# Patient Record
Sex: Male | Born: 2000 | Race: Black or African American | Hispanic: No | Marital: Single | Smoking: Never smoker
Health system: Southern US, Community
[De-identification: ages and names within clinical notes are randomized; demographics above are authoritative.]

## PROBLEM LIST (undated history)

## (undated) DIAGNOSIS — I861 Scrotal varices: Secondary | ICD-10-CM

---
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
## 2019-03-21 ENCOUNTER — Other Ambulatory Visit: Payer: Self-pay

## 2019-03-21 DIAGNOSIS — Z20822 Contact with and (suspected) exposure to covid-19: Secondary | ICD-10-CM

## 2019-03-22 LAB — NOVEL CORONAVIRUS, NAA: SARS-CoV-2, NAA: NOT DETECTED

## 2019-05-26 ENCOUNTER — Other Ambulatory Visit: Payer: Self-pay

## 2019-05-26 DIAGNOSIS — Z20822 Contact with and (suspected) exposure to covid-19: Secondary | ICD-10-CM

## 2019-05-28 LAB — NOVEL CORONAVIRUS, NAA: SARS-CoV-2, NAA: NOT DETECTED

## 2021-02-05 ENCOUNTER — Encounter (HOSPITAL_COMMUNITY): Payer: Self-pay

## 2021-02-05 ENCOUNTER — Ambulatory Visit (HOSPITAL_COMMUNITY)
Admission: RE | Admit: 2021-02-05 | Discharge: 2021-02-05 | Disposition: A | Discharge status: Home / Self Care | Payer: 59 | Source: Ambulatory Visit | Attending: Physician Assistant | Admitting: Physician Assistant

## 2021-02-05 ENCOUNTER — Other Ambulatory Visit: Payer: Self-pay

## 2021-02-05 VITALS — BP 117/67 | HR 70 | Temp 98.4°F | Resp 18

## 2021-02-05 DIAGNOSIS — U071 COVID-19: Secondary | ICD-10-CM | POA: Diagnosis not present

## 2021-02-05 DIAGNOSIS — J069 Acute upper respiratory infection, unspecified: Secondary | ICD-10-CM | POA: Insufficient documentation

## 2021-02-05 DIAGNOSIS — R059 Cough, unspecified: Secondary | ICD-10-CM | POA: Diagnosis present

## 2021-02-05 LAB — SARS CORONAVIRUS 2 (TAT 6-24 HRS): SARS Coronavirus 2: POSITIVE — AB

## 2021-02-05 MED ORDER — PREDNISONE 20 MG PO TABS: 40.0000 mg | ORAL_TABLET | Freq: Every day | ORAL | 0 refills | Status: AC

## 2021-02-05 MED ORDER — BENZONATATE 100 MG PO CAPS: 100.0000 mg | ORAL_CAPSULE | Freq: Three times a day (TID) | ORAL | 0 refills | Status: AC

## 2021-02-05 NOTE — Discharge Instructions (Addendum)
Monitor your MyChart for your COVID-19 results.  Take prednisone burst (40 mg for 3 days) to help with symptoms.  While taking this medication do not take ibuprofen, aspirin, Aleve/naproxen.  You can use Tylenol/acetaminophen for pain and other symptoms.  Use Tessalon up to 3 times a day as needed for cough.  I recommend using Flonase and Mucinex over-the-counter to help with your symptoms.  If anything worsens please return for reevaluation.

## 2021-02-05 NOTE — ED Provider Notes (Signed)
[facility]    CSN: [id number] Arrival date & time: 02/05/21  1027      History   Chief Complaint Chief Complaint  Patient presents with   Fever   Headache   Chills   Cough    HPI Chase Massey is a 20 y.o. male.   Patient presents today with a 5-day history of URI symptoms.  He reports subjective fever, headache, chills, cough, body aches, diarrhea.  Denies any chest pain, shortness of breath, nausea, vomiting.  He has tried Tylenol without improvement of symptoms.  Reports several were contacts that have recently been diagnosed with COVID-19.  Patient reports he had COVID-19 vaccination but has not had booster.  He has not had influenza vaccine.  He denies history of asthma, allergies, COPD.  He does not smoke cigarettes but does vape.  He denies any recent antibiotic use.  He is requesting a COVID-19 test to return to work.   Past Medical History:  Diagnosis Date   Left varicocele     Patient Active Problem List   Diagnosis Date Noted   Testicular/scrotal pain     History reviewed. No pertinent surgical history.     Home Medications    Prior to Admission medications   Medication Sig Start Date End Date Taking? Authorizing Provider  benzonatate (TESSALON) 100 MG capsule Take 1 capsule (100 mg total) by mouth every 8 (eight) hours. 02/05/21  Yes Aletheia Tangredi K, PA-C  predniSONE (DELTASONE) 20 MG tablet Take 2 tablets (40 mg total) by mouth daily for 3 days. 02/05/21 02/08/21 Yes Bannie Lobban, Noberto Retort, PA-C    Family History History reviewed. No pertinent family history.  Social History Social History   Tobacco Use   Smoking status: Never   Smokeless tobacco: Never  Vaping Use   Vaping Use: Every day  Substance Use Topics   Alcohol use: Yes    Comment: ocass   Drug use: No     Allergies   Patient has no known allergies.   Review of Systems Review of Systems  Constitutional:  Positive for activity change, appetite change, fatigue and fever.   HENT:  Positive for congestion and sore throat. Negative for sinus pressure and sneezing.   Respiratory:  Positive for cough. Negative for shortness of breath.   Cardiovascular:  Negative for chest pain.  Gastrointestinal:  Positive for diarrhea. Negative for abdominal pain, nausea and vomiting.  Musculoskeletal:  Positive for arthralgias and myalgias.  Neurological:  Positive for headaches. Negative for dizziness and light-headedness.    Physical Exam Triage Vital Signs ED Triage Vitals  Enc Vitals Group     BP 02/05/21 1108 117/67     Pulse Rate 02/05/21 1108 70     Resp 02/05/21 1108 18     Temp 02/05/21 1108 98.4 F (36.9 C)     Temp Source 02/05/21 1108 Oral     SpO2 02/05/21 1108 98 %     Weight --      Height --      Head Circumference --      Peak Flow --      Pain Score 02/05/21 1105 0     Pain Loc --      Pain Edu? --      Excl. in GC? --    No data found.  Updated Vital Signs BP 117/67   Pulse 70   Temp 98.4 F (36.9 C) (Oral)   Resp 18   SpO2 98%   Visual  Acuity Right Eye Distance:   Left Eye Distance:   Bilateral Distance:    Right Eye Near:   Left Eye Near:    Bilateral Near:     Physical Exam Vitals reviewed.  Constitutional:      General: He is awake.     Appearance: Normal appearance. He is normal weight. He is not ill-appearing.     Comments: Very pleasant male appears stated age in no acute distress  HENT:     Head: Normocephalic and atraumatic.     Right Ear: Tympanic membrane, ear canal and external ear normal. Tympanic membrane is not erythematous or bulging.     Left Ear: Tympanic membrane, ear canal and external ear normal. Tympanic membrane is not erythematous or bulging.     Nose: Nose normal.     Mouth/Throat:     Pharynx: Uvula midline. No oropharyngeal exudate or posterior oropharyngeal erythema.     Tonsils: 1+ on the right. 1+ on the left.  Cardiovascular:     Rate and Rhythm: Normal rate and regular rhythm.     Heart  sounds: Normal heart sounds, S1 normal and S2 normal. No murmur heard. Pulmonary:     Effort: Pulmonary effort is normal. No accessory muscle usage or respiratory distress.     Breath sounds: Normal breath sounds. No stridor. No wheezing, rhonchi or rales.     Comments: Clear to auscultation bilaterally Abdominal:     General: Bowel sounds are normal.     Palpations: Abdomen is soft.     Tenderness: There is no abdominal tenderness.  Lymphadenopathy:     Head:     Right side of head: No submental, submandibular or tonsillar adenopathy.     Left side of head: No submental, submandibular or tonsillar adenopathy.     Cervical: No cervical adenopathy.  Neurological:     Mental Status: He is alert.  Psychiatric:        Behavior: Behavior is cooperative.     UC Treatments / Results  Labs (all labs ordered are listed, but only abnormal results are displayed) Labs Reviewed  SARS CORONAVIRUS 2 (TAT 6-24 HRS)    EKG   Radiology No results found.  Procedures Procedures (including critical care time)  Medications Ordered in UC Medications - No data to display  Initial Impression / Assessment and Plan / UC Course  I have reviewed the triage vital signs and the nursing notes.  Pertinent labs & imaging results that were available during my care of the patient were reviewed by me and considered in my medical decision making (see chart for details).      No indication for flu testing given patient has been symptomatic for 5 days.  COVID-19 test was obtained per his request.  Encouraged him to follow results on MyChart we will contact him by phone if positive.  Discussed likely viral etiology of symptoms and that there is no indication for antibiotic use based on physical exam today.  Patient was prescribed prednisone burst (40 mg x 3 days) to help manage symptoms with instruction not to take NSAIDs with this medication due to risk of GI bleeding.  He was prescribed Tessalon for cough  symptoms.  Recommended he use over-the-counter medications including Tylenol, Flonase, Mucinex for symptom relief.  Discussed alarm symptoms or warrant emergent evaluation.  Strict return precautions given to which patient expressed understanding.  Final Clinical Impressions(s) / UC Diagnoses   Final diagnoses:  Upper respiratory tract infection, unspecified type  Cough     Discharge Instructions      Monitor your MyChart for your COVID-19 results.  Take prednisone burst (40 mg for 3 days) to help with symptoms.  While taking this medication do not take ibuprofen, aspirin, Aleve/naproxen.  You can use Tylenol/acetaminophen for pain and other symptoms.  Use Tessalon up to 3 times a day as needed for cough.  I recommend using Flonase and Mucinex over-the-counter to help with your symptoms.  If anything worsens please return for reevaluation.     ED Prescriptions     Medication Sig Dispense Auth. Provider   predniSONE (DELTASONE) 20 MG tablet Take 2 tablets (40 mg total) by mouth daily for 3 days. 6 tablet Kenton Fortin K, PA-C   benzonatate (TESSALON) 100 MG capsule Take 1 capsule (100 mg total) by mouth every 8 (eight) hours. 21 capsule Eloni Darius K, PA-C      PDMP not reviewed this encounter.   Jeani Hawking, PA-C 02/05/21 1127

## 2021-02-05 NOTE — ED Triage Notes (Signed)
Pt is present today with a chill, cough, HA, and fever(101). Pt states that sx started Thursday.
